# Patient Record
Sex: Male | Born: 1970 | Race: Black or African American | Hispanic: No | Smoking: Never smoker
Health system: Southern US, Community
[De-identification: ages and names within clinical notes are randomized; demographics above are authoritative.]

## PROBLEM LIST (undated history)

## (undated) DIAGNOSIS — T7840XA Allergy, unspecified, initial encounter: Secondary | ICD-10-CM

---

## 2008-02-02 ENCOUNTER — Emergency Department (HOSPITAL_BASED_OUTPATIENT_CLINIC_OR_DEPARTMENT_OTHER): Admission: EM | Admit: 2008-02-02 | Discharge: 2008-02-02 | Payer: Self-pay | Admitting: Emergency Medicine

## 2019-05-29 ENCOUNTER — Emergency Department (HOSPITAL_BASED_OUTPATIENT_CLINIC_OR_DEPARTMENT_OTHER)
Admission: EM | Admit: 2019-05-29 | Discharge: 2019-05-30 | Disposition: A | Discharge status: Home / Self Care | Payer: No Typology Code available for payment source | Attending: Emergency Medicine | Admitting: Emergency Medicine

## 2019-05-29 ENCOUNTER — Emergency Department (HOSPITAL_BASED_OUTPATIENT_CLINIC_OR_DEPARTMENT_OTHER): Payer: No Typology Code available for payment source

## 2019-05-29 ENCOUNTER — Other Ambulatory Visit: Payer: Self-pay

## 2019-05-29 ENCOUNTER — Encounter (HOSPITAL_BASED_OUTPATIENT_CLINIC_OR_DEPARTMENT_OTHER): Payer: Self-pay | Admitting: *Deleted

## 2019-05-29 DIAGNOSIS — Y999 Unspecified external cause status: Secondary | ICD-10-CM | POA: Diagnosis not present

## 2019-05-29 DIAGNOSIS — M545 Low back pain: Secondary | ICD-10-CM | POA: Diagnosis not present

## 2019-05-29 DIAGNOSIS — R202 Paresthesia of skin: Secondary | ICD-10-CM | POA: Diagnosis not present

## 2019-05-29 DIAGNOSIS — Y92410 Unspecified street and highway as the place of occurrence of the external cause: Secondary | ICD-10-CM | POA: Diagnosis not present

## 2019-05-29 DIAGNOSIS — Y9389 Activity, other specified: Secondary | ICD-10-CM | POA: Insufficient documentation

## 2019-05-29 DIAGNOSIS — R0789 Other chest pain: Secondary | ICD-10-CM | POA: Diagnosis present

## 2019-05-29 DIAGNOSIS — M7918 Myalgia, other site: Secondary | ICD-10-CM

## 2019-05-29 HISTORY — DX: Allergy, unspecified, initial encounter: T78.40XA

## 2019-05-29 MED ORDER — NAPROXEN 500 MG PO TABS: 500.0000 mg | ORAL_TABLET | Freq: Two times a day (BID) | ORAL | 0 refills | Status: AC

## 2019-05-29 MED ORDER — IBUPROFEN 800 MG PO TABS
800.0000 mg | ORAL_TABLET | Freq: Once | ORAL | Status: AC
  Administered 2019-05-29: 800 mg via ORAL
  Filled 2019-05-29: qty 1
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
